# Patient Record
Sex: Male | Born: 1935 | Race: White | Hispanic: No | Marital: Married | Smoking: Former smoker
Health system: Southern US, Community
[De-identification: ages and names within clinical notes are randomized; demographics above are authoritative.]

## PROBLEM LIST (undated history)

## (undated) DIAGNOSIS — I889 Nonspecific lymphadenitis, unspecified: Secondary | ICD-10-CM

## (undated) DIAGNOSIS — K639 Disease of intestine, unspecified: Secondary | ICD-10-CM

## (undated) DIAGNOSIS — G4730 Sleep apnea, unspecified: Secondary | ICD-10-CM

## (undated) DIAGNOSIS — I447 Left bundle-branch block, unspecified: Secondary | ICD-10-CM

## (undated) DIAGNOSIS — I454 Nonspecific intraventricular block: Secondary | ICD-10-CM

## (undated) DIAGNOSIS — L720 Epidermal cyst: Secondary | ICD-10-CM

## (undated) DIAGNOSIS — E7800 Pure hypercholesterolemia, unspecified: Secondary | ICD-10-CM

## (undated) DIAGNOSIS — I77810 Thoracic aortic ectasia: Secondary | ICD-10-CM

## (undated) DIAGNOSIS — I10 Essential (primary) hypertension: Secondary | ICD-10-CM

## (undated) DIAGNOSIS — M0760 Enteropathic arthropathies, unspecified site: Secondary | ICD-10-CM

## (undated) DIAGNOSIS — D510 Vitamin B12 deficiency anemia due to intrinsic factor deficiency: Secondary | ICD-10-CM

## (undated) DIAGNOSIS — N289 Disorder of kidney and ureter, unspecified: Secondary | ICD-10-CM

## (undated) DIAGNOSIS — E782 Mixed hyperlipidemia: Secondary | ICD-10-CM

## (undated) DIAGNOSIS — E119 Type 2 diabetes mellitus without complications: Secondary | ICD-10-CM

## (undated) DIAGNOSIS — Z905 Acquired absence of kidney: Secondary | ICD-10-CM

## (undated) DIAGNOSIS — E538 Deficiency of other specified B group vitamins: Secondary | ICD-10-CM

## (undated) DIAGNOSIS — I739 Peripheral vascular disease, unspecified: Secondary | ICD-10-CM

## (undated) DIAGNOSIS — M5416 Radiculopathy, lumbar region: Secondary | ICD-10-CM

## (undated) DIAGNOSIS — R0600 Dyspnea, unspecified: Secondary | ICD-10-CM

## (undated) DIAGNOSIS — R0989 Other specified symptoms and signs involving the circulatory and respiratory systems: Secondary | ICD-10-CM

## (undated) DIAGNOSIS — R000 Tachycardia, unspecified: Secondary | ICD-10-CM

## (undated) DIAGNOSIS — R0609 Other forms of dyspnea: Secondary | ICD-10-CM

## (undated) DIAGNOSIS — I6523 Occlusion and stenosis of bilateral carotid arteries: Secondary | ICD-10-CM

## (undated) DIAGNOSIS — I2510 Atherosclerotic heart disease of native coronary artery without angina pectoris: Secondary | ICD-10-CM

## (undated) DIAGNOSIS — G4700 Insomnia, unspecified: Secondary | ICD-10-CM

## (undated) DIAGNOSIS — I509 Heart failure, unspecified: Secondary | ICD-10-CM

## (undated) DIAGNOSIS — I255 Ischemic cardiomyopathy: Secondary | ICD-10-CM

## (undated) DIAGNOSIS — I4729 Other ventricular tachycardia: Secondary | ICD-10-CM

## (undated) DIAGNOSIS — N182 Chronic kidney disease, stage 2 (mild): Secondary | ICD-10-CM

## (undated) DIAGNOSIS — G4710 Hypersomnia, unspecified: Secondary | ICD-10-CM

## (undated) DIAGNOSIS — L918 Other hypertrophic disorders of the skin: Secondary | ICD-10-CM

## (undated) DIAGNOSIS — G4733 Obstructive sleep apnea (adult) (pediatric): Secondary | ICD-10-CM

## (undated) DIAGNOSIS — I252 Old myocardial infarction: Secondary | ICD-10-CM

## (undated) DIAGNOSIS — I472 Ventricular tachycardia: Secondary | ICD-10-CM

## (undated) HISTORY — DX: Acquired absence of kidney: Z90.5

## (undated) HISTORY — DX: Epidermal cyst: L72.0

## (undated) HISTORY — DX: Atherosclerotic heart disease of native coronary artery without angina pectoris: I25.10

## (undated) HISTORY — DX: Occlusion and stenosis of bilateral carotid arteries: I65.23

## (undated) HISTORY — DX: Other hypertrophic disorders of the skin: L91.8

## (undated) HISTORY — DX: Insomnia, unspecified: G47.00

## (undated) HISTORY — DX: Enteropathic arthropathies, unspecified site: M07.60

## (undated) HISTORY — DX: Dyspnea, unspecified: R06.00

## (undated) HISTORY — PX: TRANSURETHRAL RESECTION OF PROSTATE: SHX73

## (undated) HISTORY — DX: Peripheral vascular disease, unspecified: I73.9

## (undated) HISTORY — DX: Hypersomnia, unspecified: G47.10

## (undated) HISTORY — DX: Obstructive sleep apnea (adult) (pediatric): G47.33

## (undated) HISTORY — PX: PROSTATE SURGERY: SHX751

## (undated) HISTORY — DX: Other specified symptoms and signs involving the circulatory and respiratory systems: R09.89

## (undated) HISTORY — DX: Chronic kidney disease, stage 2 (mild): N18.2

## (undated) HISTORY — DX: Tachycardia, unspecified: R00.0

## (undated) HISTORY — DX: Deficiency of other specified B group vitamins: E53.8

## (undated) HISTORY — DX: Essential (primary) hypertension: I10

## (undated) HISTORY — DX: Other forms of dyspnea: R06.09

## (undated) HISTORY — DX: Other ventricular tachycardia: I47.29

## (undated) HISTORY — DX: Pure hypercholesterolemia, unspecified: E78.00

## (undated) HISTORY — DX: Vitamin B12 deficiency anemia due to intrinsic factor deficiency: D51.0

## (undated) HISTORY — DX: Mixed hyperlipidemia: E78.2

## (undated) HISTORY — DX: Disease of intestine, unspecified: K63.9

## (undated) HISTORY — DX: Nonspecific lymphadenitis, unspecified: I88.9

## (undated) HISTORY — DX: Morbid (severe) obesity due to excess calories: E66.01

## (undated) HISTORY — DX: Radiculopathy, lumbar region: M54.16

## (undated) HISTORY — DX: Heart failure, unspecified: I50.9

## (undated) HISTORY — DX: Sleep apnea, unspecified: G47.30

## (undated) HISTORY — DX: Ischemic cardiomyopathy: I25.5

## (undated) HISTORY — DX: Ventricular tachycardia: I47.2

## (undated) HISTORY — DX: Thoracic aortic ectasia: I77.810

## (undated) HISTORY — DX: Old myocardial infarction: I25.2

## (undated) HISTORY — DX: Type 2 diabetes mellitus without complications: E11.9

## (undated) HISTORY — DX: Left bundle-branch block, unspecified: I44.7

---

## 1999-09-11 HISTORY — PX: NEPHRECTOMY: SHX65

## 2010-03-10 DEATH — deceased

## 2015-03-15 ENCOUNTER — Encounter (HOSPITAL_BASED_OUTPATIENT_CLINIC_OR_DEPARTMENT_OTHER): Payer: Self-pay | Admitting: Emergency Medicine

## 2015-03-15 ENCOUNTER — Emergency Department (HOSPITAL_BASED_OUTPATIENT_CLINIC_OR_DEPARTMENT_OTHER)
Admission: EM | Admit: 2015-03-15 | Discharge: 2015-03-16 | Disposition: A | Discharge status: Home / Self Care | Payer: Medicare HMO | Attending: Emergency Medicine | Admitting: Emergency Medicine

## 2015-03-15 ENCOUNTER — Emergency Department (HOSPITAL_BASED_OUTPATIENT_CLINIC_OR_DEPARTMENT_OTHER): Payer: Medicare HMO

## 2015-03-15 DIAGNOSIS — Z79899 Other long term (current) drug therapy: Secondary | ICD-10-CM | POA: Insufficient documentation

## 2015-03-15 DIAGNOSIS — R079 Chest pain, unspecified: Secondary | ICD-10-CM

## 2015-03-15 DIAGNOSIS — Z7982 Long term (current) use of aspirin: Secondary | ICD-10-CM | POA: Insufficient documentation

## 2015-03-15 DIAGNOSIS — I129 Hypertensive chronic kidney disease with stage 1 through stage 4 chronic kidney disease, or unspecified chronic kidney disease: Secondary | ICD-10-CM | POA: Insufficient documentation

## 2015-03-15 DIAGNOSIS — N189 Chronic kidney disease, unspecified: Secondary | ICD-10-CM | POA: Diagnosis not present

## 2015-03-15 DIAGNOSIS — Z87891 Personal history of nicotine dependence: Secondary | ICD-10-CM | POA: Insufficient documentation

## 2015-03-15 HISTORY — DX: Essential (primary) hypertension: I10

## 2015-03-15 HISTORY — DX: Nonspecific intraventricular block: I45.4

## 2015-03-15 HISTORY — DX: Disorder of kidney and ureter, unspecified: N28.9

## 2015-03-15 LAB — BASIC METABOLIC PANEL
ANION GAP: 10 (ref 5–15)
BUN: 24 mg/dL — AB (ref 6–20)
CALCIUM: 9.5 mg/dL (ref 8.9–10.3)
CO2: 28 mmol/L (ref 22–32)
Chloride: 103 mmol/L (ref 101–111)
Creatinine, Ser: 1.55 mg/dL — ABNORMAL HIGH (ref 0.61–1.24)
GFR calc Af Amer: 48 mL/min — ABNORMAL LOW (ref >60)
GFR calc non Af Amer: 41 mL/min — ABNORMAL LOW (ref >60)
Glucose, Bld: 106 mg/dL — ABNORMAL HIGH (ref 65–99)
Potassium: 3.8 mmol/L (ref 3.5–5.1)
SODIUM: 141 mmol/L (ref 135–145)

## 2015-03-15 LAB — CBC
HCT: 46.4 % (ref 39.0–52.0)
HEMOGLOBIN: 15.2 g/dL (ref 13.0–17.0)
MCH: 30.5 pg (ref 26.0–34.0)
MCHC: 32.8 g/dL (ref 30.0–36.0)
MCV: 93 fL (ref 78.0–100.0)
PLATELETS: 197 10*3/uL (ref 150–400)
RBC: 4.99 MIL/uL (ref 4.22–5.81)
RDW: 14 % (ref 11.5–15.5)
WBC: 5.8 10*3/uL (ref 4.0–10.5)

## 2015-03-15 LAB — TROPONIN I: Troponin I: <0.03 ng/mL (ref <0.031)

## 2015-03-15 LAB — BRAIN NATRIURETIC PEPTIDE: B Natriuretic Peptide: 195.6 pg/mL — ABNORMAL HIGH (ref 0.0–100.0)

## 2015-03-15 LAB — D-DIMER, QUANTITATIVE: D-Dimer, Quant: <0.27 ug/mL-FEU (ref 0.00–0.48)

## 2015-03-15 MED ORDER — GI COCKTAIL ~~LOC~~
30.0000 mL | Freq: Once | ORAL | Status: AC
  Administered 2015-03-15: 30 mL via ORAL
  Filled 2015-03-15: qty 30

## 2015-03-15 NOTE — ED Notes (Signed)
MD at bedside. 

## 2015-03-15 NOTE — ED Provider Notes (Signed)
CSN: [id number]     Arrival date & time 03/15/15  1945 History  This chart was scribed for Mirian Mo, MD by Murriel Hopper, ED Scribe. This patient was seen in room MH01/MH01 and the patient's care was started at 8:10 PM.    Chief Complaint  Patient presents with  . Chest Pain      Patient is a 79 y.o. male presenting with chest pain. The history is provided by the patient. No language interpreter was used.  Chest Pain Pain location:  L chest Pain quality: aching and sharp   Pain radiates to:  Does not radiate Pain radiates to the back: no   Pain severity:  No pain Onset quality:  Sudden Duration:  1 minute Timing:  Intermittent Associated symptoms: no cough, no diaphoresis and no fever      HPI Comments: Bruce Rosales is a 79 y.o. male who presents to the Emergency Department complaining of intermittent left-sided chest pain that occurred 16 hours pta. Pt reports that he woke up around 0130 this morning and states he had sharp, aching pains in the left side of this chest that lasted 3-5 seconds at a time and then would go away for a few seconds and come back. Pt states that his intermittent pain lasted a total of 30 seconds. Pt states he reported to ED today because he came in today because he felt, "washed out" and states his stomach feels weird. Pt denies diaphoresis, radiating pain, cough, fever. Pt is not a smoker.    Past Medical History  Diagnosis Date  . Renal disorder   . Hypertension   . Bundle branch block    Past Surgical History  Procedure Laterality Date  . Nephrectomy Right 2001  . Prostate surgery    . Transurethral resection of prostate     No family history on file. History  Substance Use Topics  . Smoking status: Former Games developer  . Smokeless tobacco: Not on file  . Alcohol Use: No    Review of Systems  Constitutional: Negative for fever and diaphoresis.  Respiratory: Negative for cough.   Cardiovascular: Positive for chest pain.  All other  systems reviewed and are negative.     Allergies  Review of patient's allergies indicates no known allergies.  Home Medications   Prior to Admission medications   Medication Sig Start Date End Date Taking? Authorizing Provider  aspirin 81 MG tablet Take 81 mg by mouth daily.   Yes Historical Provider, MD  cholecalciferol (VITAMIN D) 1000 UNITS tablet Take 2,000 Units by mouth daily.   Yes Historical Provider, MD  labetalol (NORMODYNE) 200 MG tablet Take 200 mg by mouth 2 (two) times daily.   Yes Historical Provider, MD  simvastatin (ZOCOR) 20 MG tablet Take 20 mg by mouth daily.   Yes Historical Provider, MD   BP 136/88 mmHg  Pulse 69  Temp(Src) 98.3 F (36.8 C) (Oral)  Resp 13  Ht 5' 10.5" (1.791 m)  Wt 288 lb (130.636 kg)  BMI 40.73 kg/m2  SpO2 96% Physical Exam  Constitutional: He is oriented to person, place, and time. He appears well-developed and well-nourished.  HENT:  Head: Normocephalic and atraumatic.  Eyes: Conjunctivae and EOM are normal.  Neck: Normal range of motion. Neck supple.  Cardiovascular: Normal rate, regular rhythm and normal heart sounds.   Pulmonary/Chest: Effort normal and breath sounds normal. No respiratory distress.  Abdominal: He exhibits no distension. There is no tenderness. There is no rebound and no guarding.  Musculoskeletal: Normal range of motion.  Neurological: He is alert and oriented to person, place, and time.  Skin: Skin is warm and dry.  Vitals reviewed.   ED Course  Procedures (including critical care time)  DIAGNOSTIC STUDIES: Oxygen Saturation is 98% on room air, normal by my interpretation.    COORDINATION OF CARE: 8:15 PM Discussed treatment plan with pt at bedside and pt agreed to plan.   Labs Review Labs Reviewed  BASIC METABOLIC PANEL - Abnormal; Notable for the following:    Glucose, Bld 106 (*)    BUN 24 (*)    Creatinine, Ser 1.55 (*)    GFR calc non Af Amer 41 (*)    GFR calc Af Amer 48 (*)    All other  components within normal limits  BRAIN NATRIURETIC PEPTIDE - Abnormal; Notable for the following:    B Natriuretic Peptide 195.6 (*)    All other components within normal limits  CBC  TROPONIN I  D-DIMER, QUANTITATIVE (NOT [facility])  TROPONIN I    Imaging Review Dg Chest 2 View  03/15/2015   CLINICAL DATA:  Acute onset of intermittent sharp left-sided chest pain. Initial encounter.  EXAM: CHEST  2 VIEW  COMPARISON:  None.  FINDINGS: The lungs are well-aerated. Vascular congestion is noted. Mildly increased interstitial markings could reflect mild interstitial edema. There is no evidence of pleural effusion or pneumothorax.  The heart is borderline normal in size. No acute osseous abnormalities are seen. Calcifications are seen overlying the right upper quadrant, possibly reflecting gallstones.  IMPRESSION: Vascular congestion noted. Mildly increased interstitial markings could reflect mild interstitial edema.   Electronically Signed   By: Roanna RaiderJeffery  Chang M.D.   On: 03/15/2015 20:56     EKG Interpretation   Date/Time:  Tuesday March 15 2015 19:53:53 EDT Ventricular Rate:  87 PR Interval:  198 QRS Duration: 178 QT Interval:  424 QTC Calculation: 510 R Axis:   -40 Text Interpretation:  Normal sinus rhythm Left axis deviation Left bundle  branch block Abnormal ECG No old tracing to compare Confirmed by Mirian MoGentry,  Matthew [phone]([id number]) on 03/15/2015 8:00:06 PM      MDM   Final diagnoses:  Chest pain, unspecified chest pain type    79 y.o. male with pertinent PMH of CKD, HTN, chronic BBB presents with chest pain as above, atypical for ACS with subsequent gi upset.  On arrival vital signs and physical exam as above. Given GI cocktail with relief of symptoms. Workup, including delta troponin, unremarkable. Patient did have a mild elevation in BNP, however has no signs of volume overload on exam, no Rales, no pedal edema. Discharged home in stable condition to follow-up with cardiology.    I have  reviewed all laboratory and imaging studies if ordered as above  1. Chest pain, unspecified chest pain type           Mirian MoMatthew Gentry, MD 03/16/15 0040

## 2015-03-15 NOTE — ED Notes (Signed)
C/o chest pain last night down left side of abdomen no radiation. Mild nausea.

## 2015-03-16 LAB — TROPONIN I: Troponin I: <0.03 ng/mL (ref <0.031)

## 2015-03-16 NOTE — Discharge Instructions (Signed)

## 2016-10-18 IMAGING — CR DG CHEST 2V
2 series · 2 of 2 positions shown · non-contrast
Comparison: None.

CLINICAL DATA: Acute onset of intermittent sharp left-sided chest
pain. Initial encounter.

EXAM:
CHEST  2 VIEW

[w chest pa]
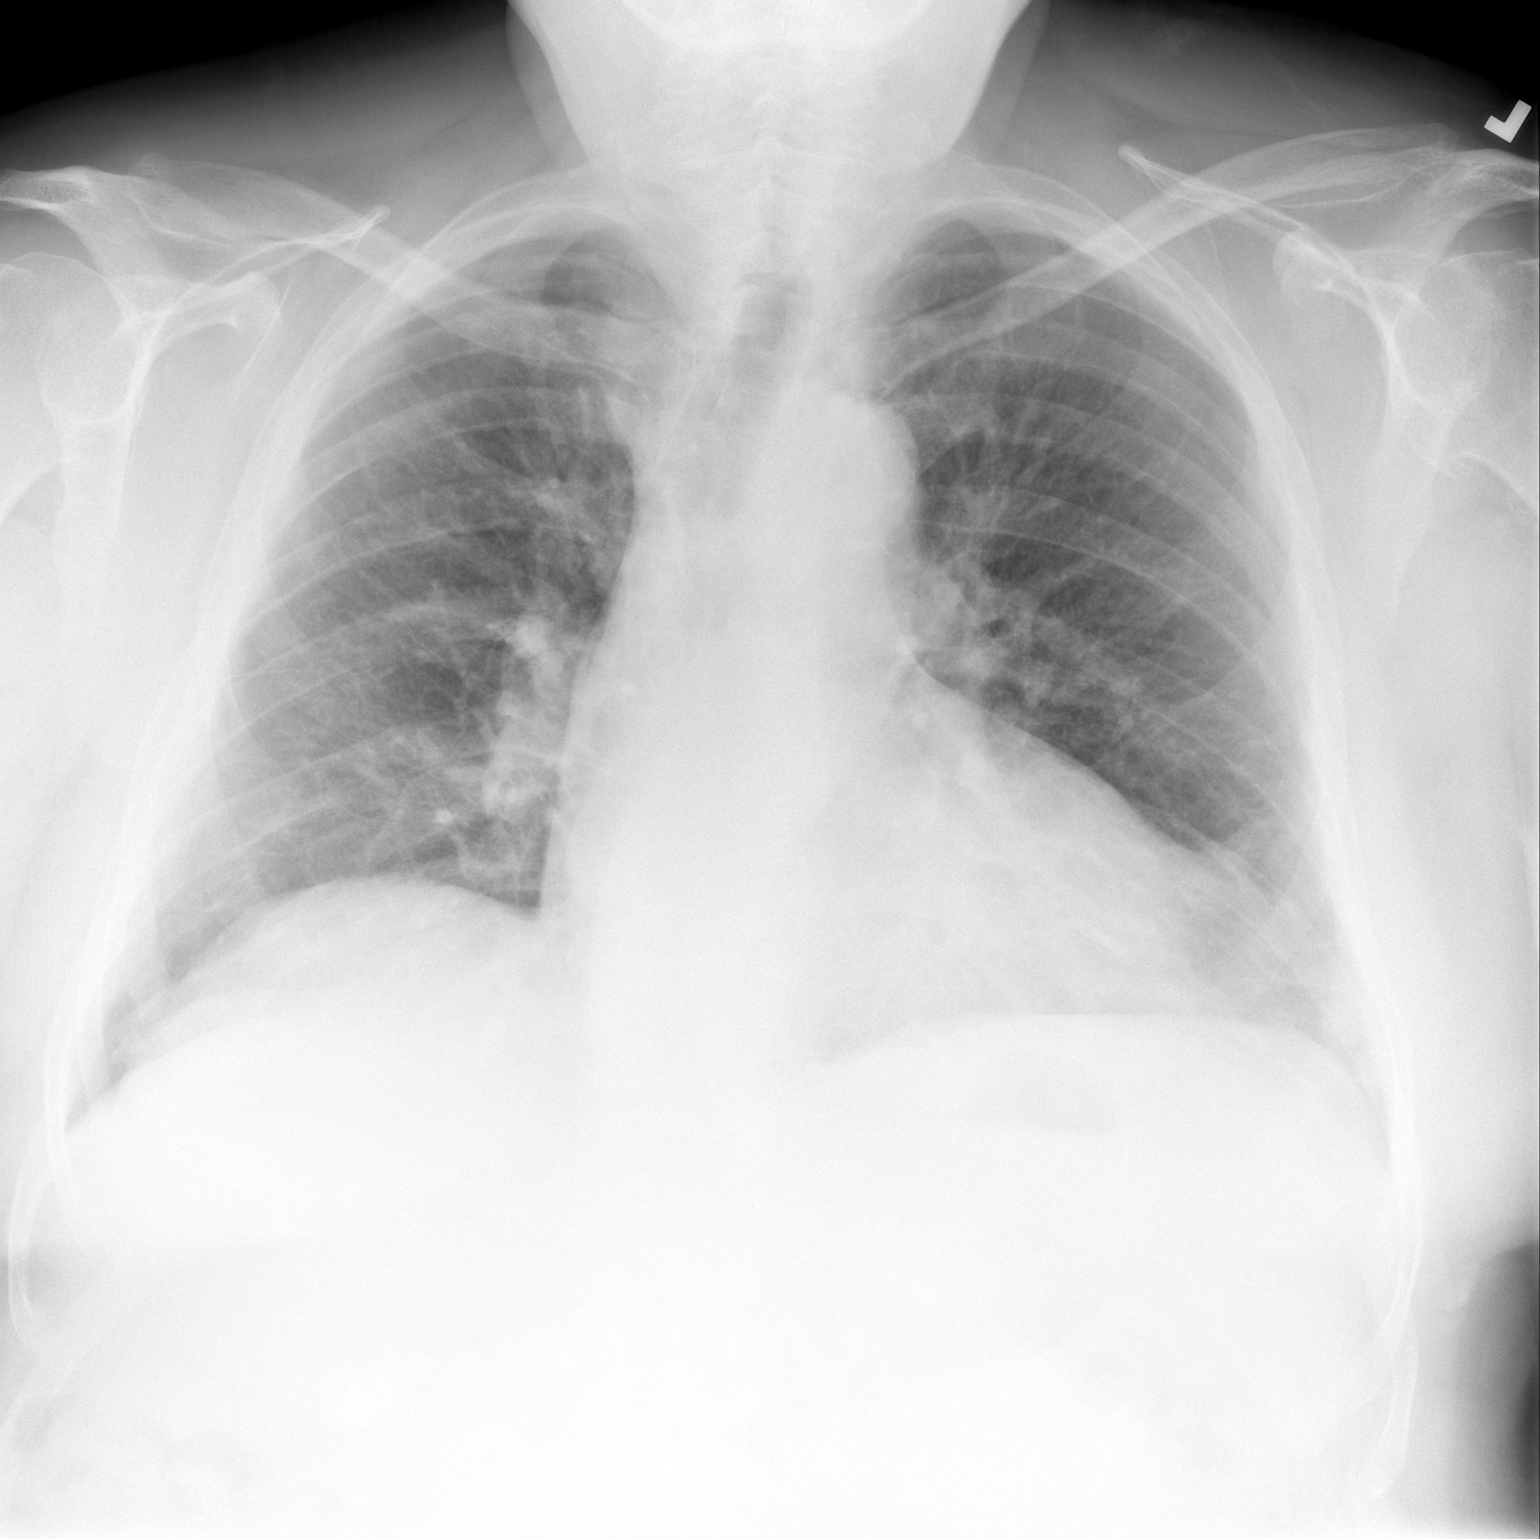

[w chest lat]
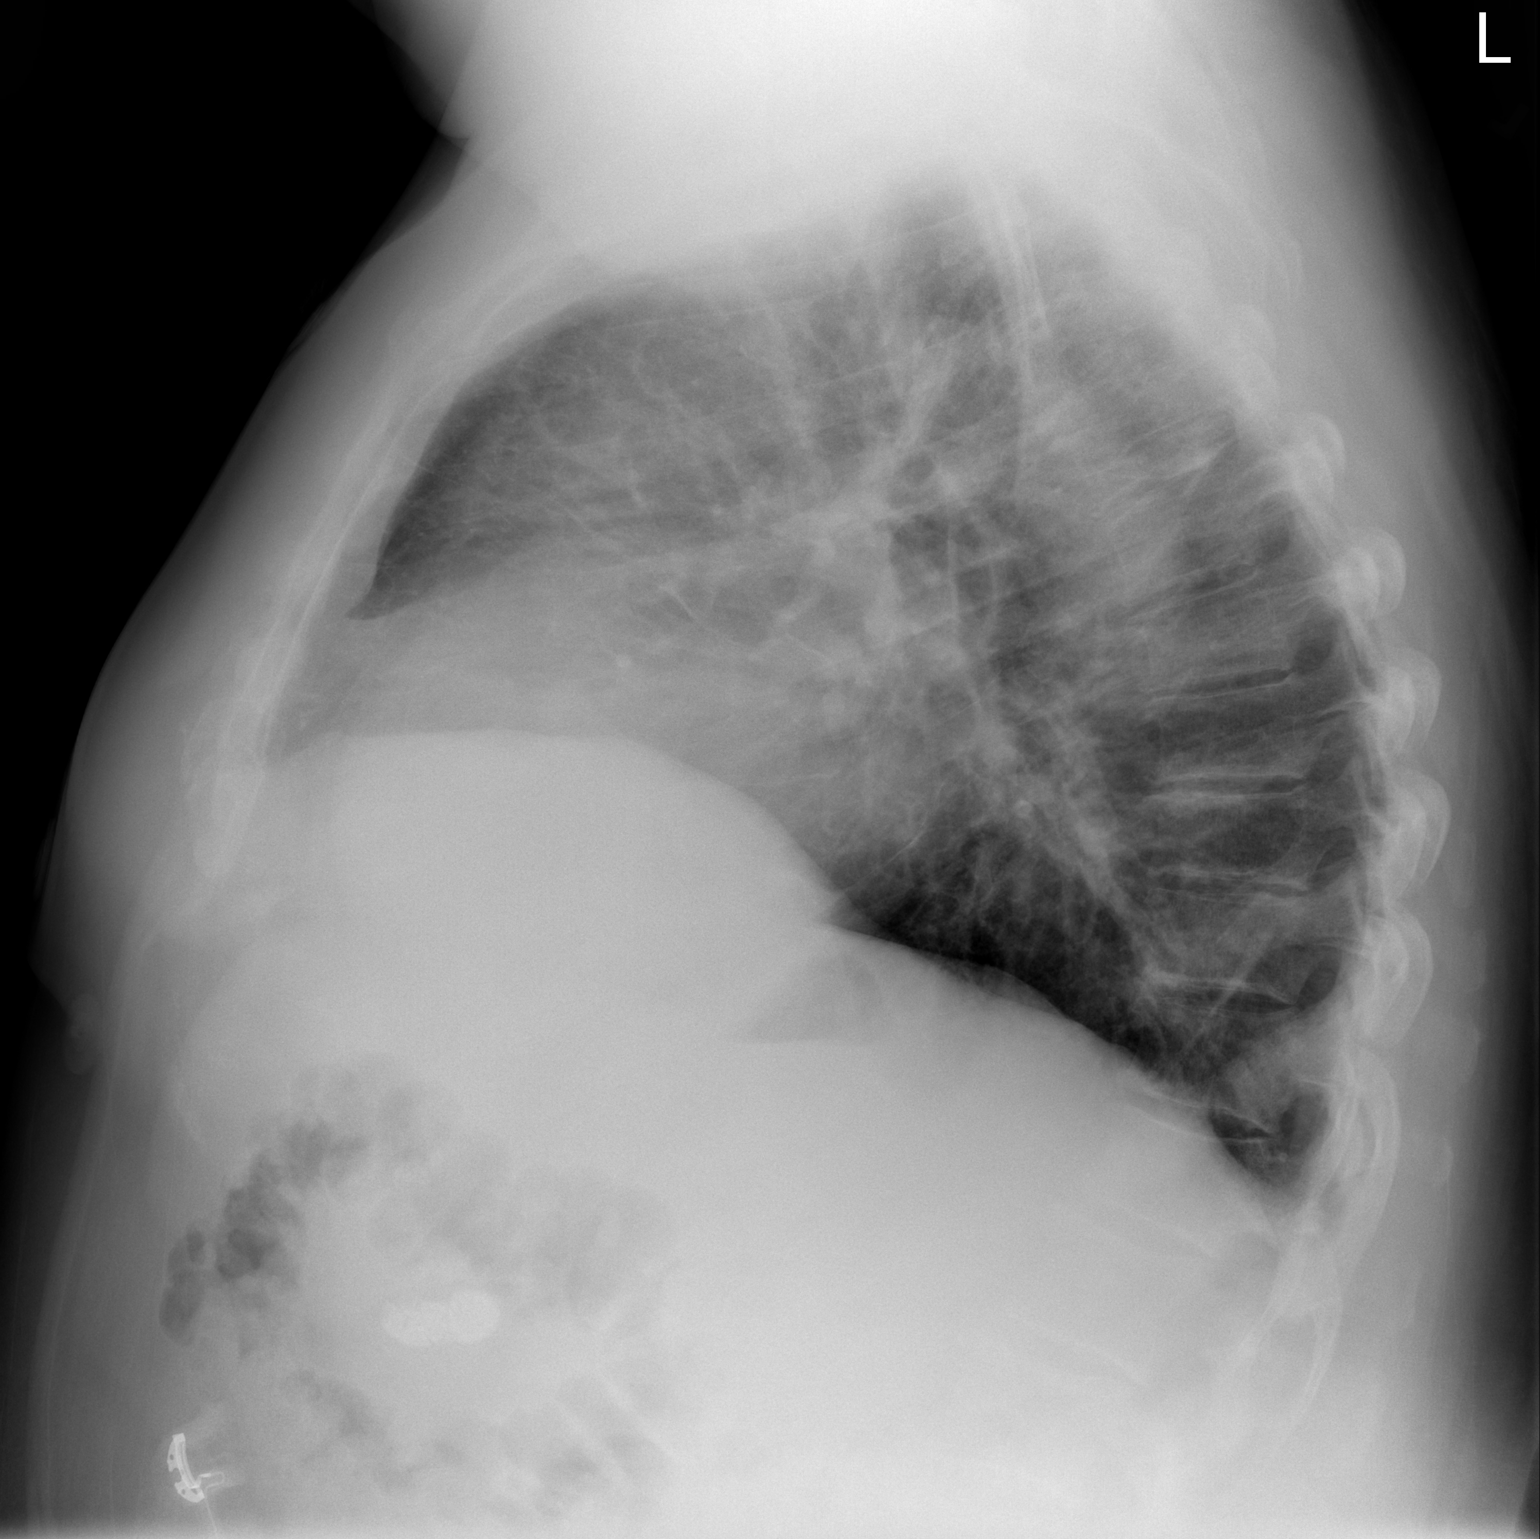

[2 of 2 positions shown; findings below may reference images not displayed]

FINDINGS: The lungs are well-aerated. Vascular congestion is noted. Mildly
increased interstitial markings could reflect mild interstitial
edema. There is no evidence of pleural effusion or pneumothorax.

The heart is borderline normal in size. No acute osseous
abnormalities are seen. Calcifications are seen overlying the right
upper quadrant, possibly reflecting gallstones.
IMPRESSION: Vascular congestion noted. Mildly increased interstitial markings
could reflect mild interstitial edema.

## 2020-05-19 ENCOUNTER — Ambulatory Visit (HOSPITAL_COMMUNITY)
Admission: RE | Admit: 2020-05-19 | Discharge: 2020-05-19 | Disposition: A | Discharge status: Home / Self Care | Payer: Medicare Other | Source: Ambulatory Visit | Attending: Pulmonary Disease | Admitting: Pulmonary Disease

## 2020-05-19 ENCOUNTER — Telehealth: Payer: Self-pay | Admitting: Oncology

## 2020-05-19 ENCOUNTER — Other Ambulatory Visit: Payer: Self-pay | Admitting: Oncology

## 2020-05-19 DIAGNOSIS — U071 COVID-19: Secondary | ICD-10-CM | POA: Diagnosis present

## 2020-05-19 DIAGNOSIS — Z23 Encounter for immunization: Secondary | ICD-10-CM | POA: Insufficient documentation

## 2020-05-19 MED ORDER — ALBUTEROL SULFATE HFA 108 (90 BASE) MCG/ACT IN AERS: 2.0000 | INHALATION_SPRAY | Freq: Once | RESPIRATORY_TRACT | Status: DC | PRN

## 2020-05-19 MED ORDER — SODIUM CHLORIDE 0.9 % IV SOLN: INTRAVENOUS | Status: DC | PRN

## 2020-05-19 MED ORDER — FAMOTIDINE IN NACL 20-0.9 MG/50ML-% IV SOLN: 20.0000 mg | Freq: Once | INTRAVENOUS | Status: DC | PRN

## 2020-05-19 MED ORDER — SODIUM CHLORIDE 0.9 % IV SOLN
1200.0000 mg | Freq: Once | INTRAVENOUS | Status: AC
  Administered 2020-05-19: 1200 mg via INTRAVENOUS
  Filled 2020-05-19: qty 10

## 2020-05-19 MED ORDER — DIPHENHYDRAMINE HCL 50 MG/ML IJ SOLN: 50.0000 mg | Freq: Once | INTRAMUSCULAR | Status: DC | PRN

## 2020-05-19 MED ORDER — METHYLPREDNISOLONE SODIUM SUCC 125 MG IJ SOLR: 125.0000 mg | Freq: Once | INTRAMUSCULAR | Status: DC | PRN

## 2020-05-19 MED ORDER — EPINEPHRINE 0.3 MG/0.3ML IJ SOAJ: 0.3000 mg | Freq: Once | INTRAMUSCULAR | Status: DC | PRN

## 2020-05-19 NOTE — Progress Notes (Signed)
  Diagnosis: COVID-19  Physician: Wright    Procedure: [facility] Med: casirivimab\imdevimab infusion - Provided patient with casirivimab\imdevimab fact sheet for patients, parents and caregivers prior to infusion.  Complications: No immediate complications noted.  Discharge: Discharged home   Bruce Rosales S 05/19/2020   

## 2020-05-19 NOTE — Telephone Encounter (Signed)
I connected by phone with Bruce Rosales to discuss the potential use of an new treatment for mild to moderate COVID-19 viral infection in non-hospitalized patients.   This patient is a age/sex that meets the FDA criteria for Emergency Use Authorization of casirivimab\imdevimab.  Has a (+) direct SARS-CoV-2 viral test result 1. Has mild or moderate COVID-19  2. Is ? 82 years of age and weighs ? 40 kg 3. Is NOT hospitalized due to COVID-19 4. Is NOT requiring oxygen therapy or requiring an increase in baseline oxygen flow rate due to COVID-19 5. Is within 10 days of symptom onset 6. Has at least one of the high risk factor(s) for progression to severe COVID-19 and/or hospitalization as defined in EUA. ? Specific high risk criteria :Age    Symptom onset  05/14/2020   I have spoken and communicated the following to the patient or parent/caregiver:   1. FDA has authorized the emergency use of casirivimab\imdevimab for the treatment of mild to moderate COVID-19 in adults and pediatric patients with positive results of direct SARS-CoV-2 viral testing who are 37 years of age and older weighing at least 40 kg, and who are at high risk for progressing to severe COVID-19 and/or hospitalization.   2. The significant known and potential risks and benefits of casirivimab\imdevimab, and the extent to which such potential risks and benefits are unknown.   3. Information on available alternative treatments and the risks and benefits of those alternatives, including clinical trials.   4. Patients treated with casirivimab\imdevimab should continue to self-isolate and use infection control measures (e.g., wear mask, isolate, social distance, avoid sharing personal items, clean and disinfect "high touch" surfaces, and frequent handwashing) according to CDC guidelines.    5. The patient or parent/caregiver has the option to accept or refuse casirivimab\imdevimab .   After reviewing this information with the  patient, The patient agreed to proceed with receiving casirivimab\imdevimab infusion and will be provided a copy of the Fact sheet prior to receiving the infusion.[name], AGNP-C [phone] (Infusion Center Hotline)

## 2020-05-19 NOTE — Discharge Instructions (Signed)

## 2021-03-17 ENCOUNTER — Telehealth: Payer: Self-pay

## 2021-03-17 NOTE — Telephone Encounter (Signed)
NOTES ON FILE FROM [facility] [phone] REFERRAL SENT TO SCHEDULING 

## 2021-05-01 ENCOUNTER — Encounter (INDEPENDENT_AMBULATORY_CARE_PROVIDER_SITE_OTHER): Payer: Self-pay

## 2021-05-01 ENCOUNTER — Other Ambulatory Visit: Payer: Self-pay

## 2021-05-01 ENCOUNTER — Ambulatory Visit: Payer: Medicare HMO | Admitting: Internal Medicine

## 2021-05-01 VITALS — BP 110/70 | HR 70 | Ht 70.5 in | Wt 290.8 lb

## 2021-05-01 DIAGNOSIS — I255 Ischemic cardiomyopathy: Secondary | ICD-10-CM

## 2021-05-01 NOTE — Patient Instructions (Addendum)
Medication Instructions:  Your physician recommends that you continue on your current medications as directed. Please refer to the Current Medication list given to you today.  Labwork: None ordered.  Testing/Procedures: None ordered.  Follow-Up: Your physician wants you to follow-up in: As needed with Dr. Allred.    Any Other Special Instructions Will Be Listed Below (If Applicable).  If you need a refill on your cardiac medications before your next appointment, please call your pharmacy.       

## 2021-05-01 NOTE — Progress Notes (Signed)
Electrophysiology Office Note   Date:  05/01/2021   ID:  Bruce Rosales, DOB Jul 16, 1936, MRN [id number]  PCP:  Karle Plumber, MD  Cardiologist:  Arnette Felts Primary Electrophysiologist: Hillis Range, MD    CC: CHF   History of Present Illness: Bruce Rosales is a 85 y.o. male who presents today for electrophysiology evaluation.   He is referred by Arnette Felts for EP consultation regarding CHF. The patient has a h/o CHF.  He had recent echo which showed normal EF with moderate LVH.  He has been treated with optimal medical therapy with improvement in CHF symptoms.  He has also been followed by nephrology with stable renal dysfunction.  He has some fatigue but generally feels well.  He has sinus with LBBB and PVCs.  He has rare NSVT without symptoms. SOB and edema are improved.  Today, he denies symptoms of palpitations, chest pain,  dizziness, presyncope, syncope, bleeding, or neurologic sequela. The patient is tolerating medications without difficulties and is otherwise without complaint today.    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    Bruit    Bundle branch block    CAD (coronary artery disease)    Carotid stenosis, bilateral    CHF (congestive heart failure) (HCC)    Chronic kidney disease, stage 2 (mild)    cystic kidney disease    Diabetes mellitus type 2, controlled, without complications (HCC)    DOE (dyspnea on exertion)    Ectatic thoracic aorta (HCC)    Epidermal inclusion cyst    Fibroepithelial polyp    Heart failure, unspecified (HCC)    History of myocardial infarction    HTN (hypertension)    Hypercholesterolemia    Hypersomnia    Hypertension    Insomnia    Ischemic cardiomyopathy    Lumbar radiculitis    Lymphadenitis    Mixed hyperlipidemia    Morbid obesity (HCC)    NSVT (nonsustained ventricular tachycardia) (HCC)    OSA (obstructive sleep apnea)    PAD (peripheral  artery disease) (HCC)    Pernicious anemia    Renal disorder    Single kidney    Sleep apnea    Tachycardia    Vitamin B12 deficiency    Past Surgical History:  Procedure Laterality Date   NEPHRECTOMY Right 2001   PROSTATE SURGERY     TRANSURETHRAL RESECTION OF PROSTATE       Current Outpatient Medications  Medication Sig Dispense Refill   amLODipine (NORVASC) 5 MG tablet Take 1 tablet by mouth daily.     aspirin 81 MG tablet Take 81 mg by mouth daily.     atorvastatin (LIPITOR) 40 MG tablet Take 1 tablet by mouth daily.     carvedilol (COREG) 25 MG tablet Take 25 mg by mouth 2 (two) times daily.     cholecalciferol (VITAMIN D) 1000 UNITS tablet Take 2,000 Units by mouth daily.     ENTRESTO 97-103 MG Take 1 tablet by mouth 2 (two) times daily.     FARXIGA 10 MG TABS tablet Take 10 mg by mouth daily.     furosemide (LASIX) 20 MG tablet Take 20 mg by mouth 2 (two) times a week.     hydrALAZINE (APRESOLINE) 25 MG tablet Take 25 mg by mouth 3 (three) times daily.     isosorbide mononitrate (IMDUR) 30 MG 24 hr tablet Take 30 mg by mouth daily.     labetalol (NORMODYNE) 200  MG tablet Take 200 mg by mouth 2 (two) times daily.     No current facility-administered medications for this visit.    Allergies:   Patient has no known allergies.   Social History:  The patient  reports that he has quit smoking. He does not have any smokeless tobacco history on file. He reports that he does not drink alcohol and does not use drugs.   Family History:  The patient's family history includes Hypertension in his mother.    ROS:  Please see the history of present illness.   All other systems are personally reviewed and negative.    PHYSICAL EXAM: VS:  BP 110/70   Pulse 70   Ht 5' 10.5" (1.791 m)   Wt 290 lb 12.8 oz (131.9 kg)   SpO2 95%   BMI 41.14 kg/m  , BMI Body mass index is 41.14 kg/m. GEN: Well nourished, well developed, in no acute distress HEENT: normal Neck: no JVD, carotid  bruits, or masses Cardiac: RRR; no murmurs, rubs, or gallops,no edema  Respiratory:  clear to auscultation bilaterally, normal work of breathing GI: soft, nontender, nondistended, + BS MS: no deformity or atrophy Skin: warm and dry  Neuro:  Strength and sensation are intact Psych: euthymic mood, full affect  EKG:  EKG is ordered today. The ekg ordered today is personally reviewed and shows sinus rhythm 70 bpm, PR 230 msec, QRS 180 msec, LBBB   Recent Labs: No results found for requested labs within last 8760 hours.  personally reviewed   Lipid Panel  No results found for: CHOL, TRIG, HDL, CHOLHDL, VLDL, LDLCALC, LDLDIRECT personally reviewed   Wt Readings from Last 3 Encounters:  05/01/21 290 lb 12.8 oz (131.9 kg)  03/15/15 288 lb (130.6 kg)      Other studies personally reviewed: Additional studies/ records that were reviewed today include: records from [facility] including prior echo  Review of the above records today demonstrates: as above   ASSESSMENT AND PLAN:  1.  Nonischemic diastolic dysfunction with chronic CHF. The causes of this patients CHF is likely multifactorial and including HTN, CRI, diastolic dysfunction related to age, and LVH.  He had reduced EF which has normalized with medical therapy.  He has LBBB.  He has rare PVCs and nonsustained VT for which he has been asymptomatic.  Denies syncope. Given EF >55%, he does not meet criteria for CRT or ICD at this time.  Given advanced age, we should avoid EP procedures at this time. He will follow-up with Arnette Felts and I will see as needed.      Randolm Idol, MD  05/01/2021 12:02 PM     [facility] [address] [place] [address] [phone] (office) [phone] (fax)
